# Patient Record
Sex: Male | Born: 1970 | Race: White | Hispanic: No | Marital: Married | Smoking: Current every day smoker
Health system: Southern US, Community
[De-identification: ages and names within clinical notes are randomized; demographics above are authoritative.]

## PROBLEM LIST (undated history)

## (undated) DIAGNOSIS — I10 Essential (primary) hypertension: Secondary | ICD-10-CM

---

## 2005-05-08 ENCOUNTER — Emergency Department: Payer: Self-pay | Admitting: Emergency Medicine

## 2006-05-29 ENCOUNTER — Emergency Department: Payer: Self-pay | Admitting: Emergency Medicine

## 2009-09-23 ENCOUNTER — Emergency Department: Payer: Self-pay | Admitting: Emergency Medicine

## 2009-09-30 ENCOUNTER — Emergency Department: Payer: Self-pay | Admitting: Emergency Medicine

## 2010-11-23 IMAGING — CT CT CHEST W/ CM
2 series · 15 of 32 positions shown, 19 images · non-contrast
Comparison: none

REASON FOR EXAM: left lower posterior and lateral severe back pain and
diaphoresis
COMMENTS:   LMP: (Male)

[Series 5: soft tissue · axial · 0.67mm/px · 1 of 106 slices shown]
[im 9/106  mediastinal]
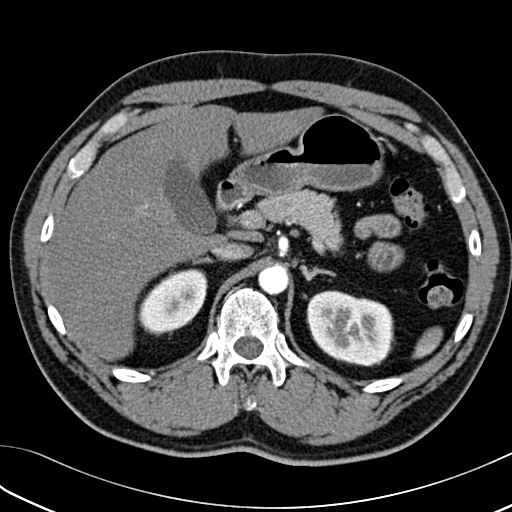

[Series 6: lung windows · axial · 0.67mm/px · z∈[-290,-26]mm · 14 of 104 slices shown, 18 images]
[im 8/104  mediastinal]
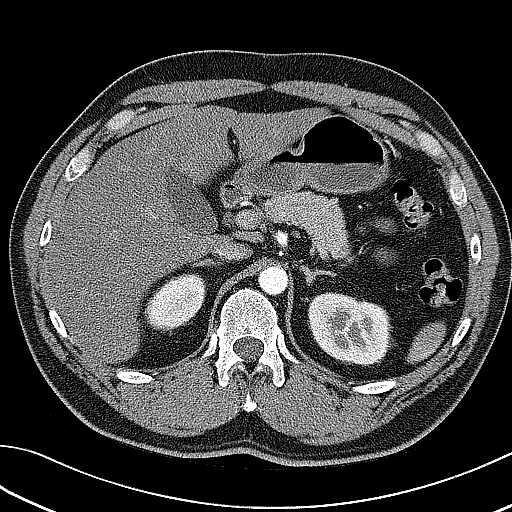
[im 8/104  lung]
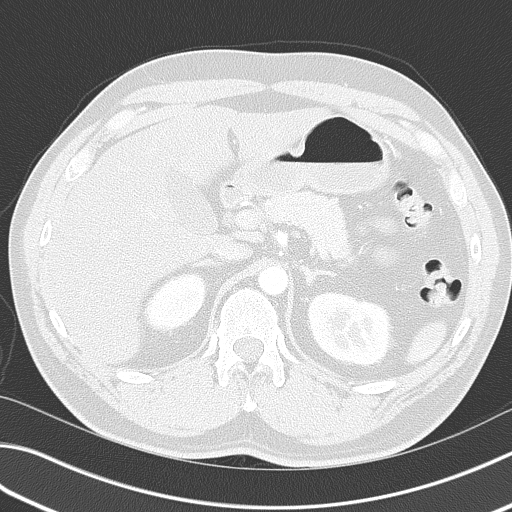
[im 16/104  lung]
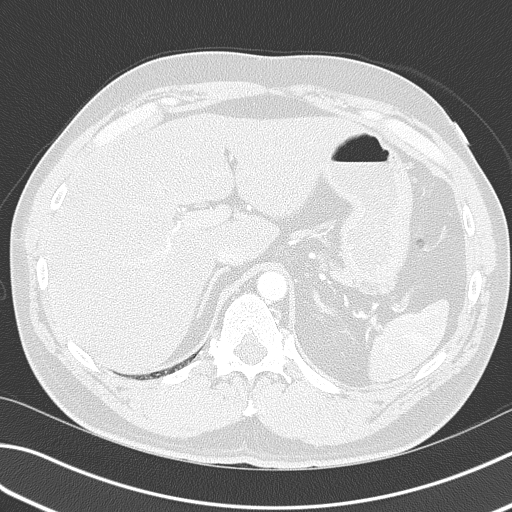
[im 24/104  lung]
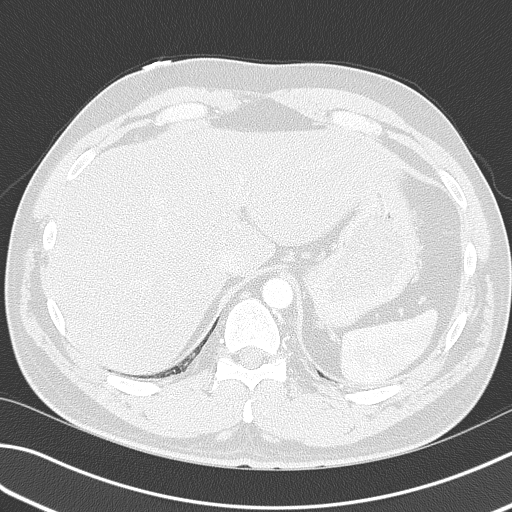
[im 32/104  lung]
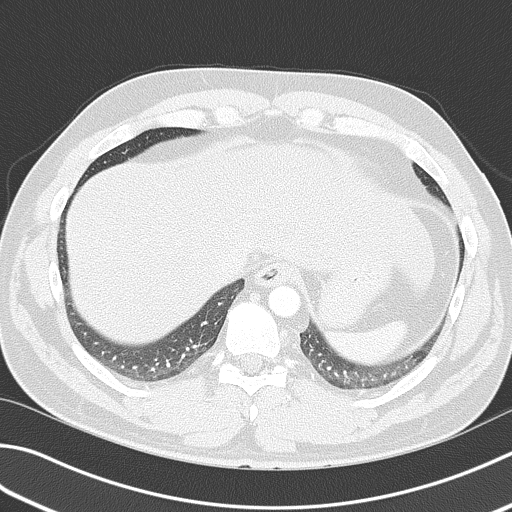
[im 40/104  mediastinal]
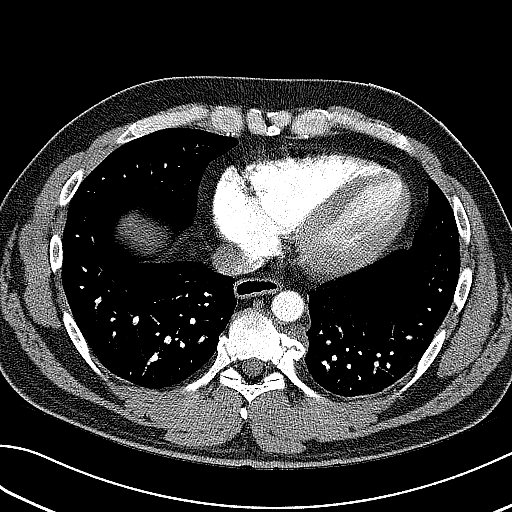
[im 40/104  lung]
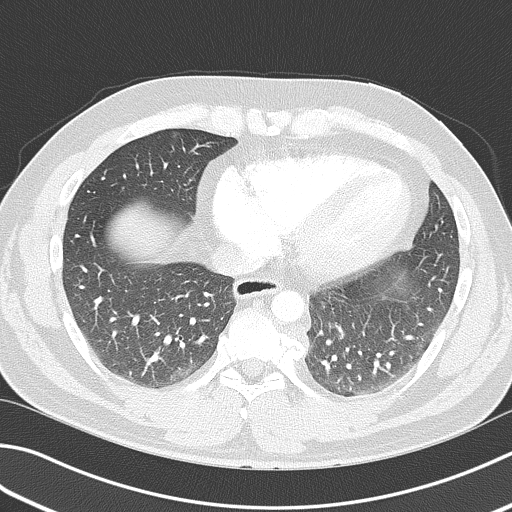
[im 48/104  lung]
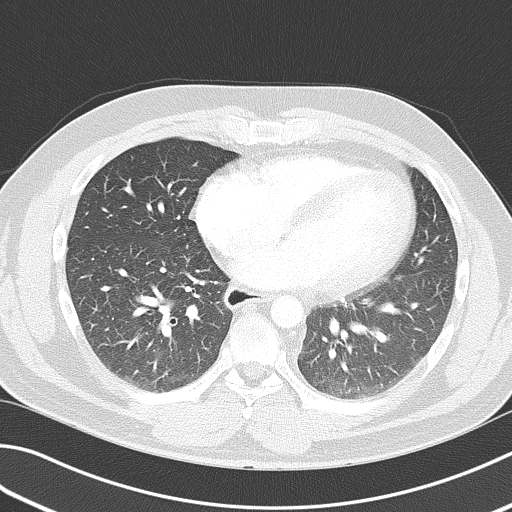
[im 49/104  lung]
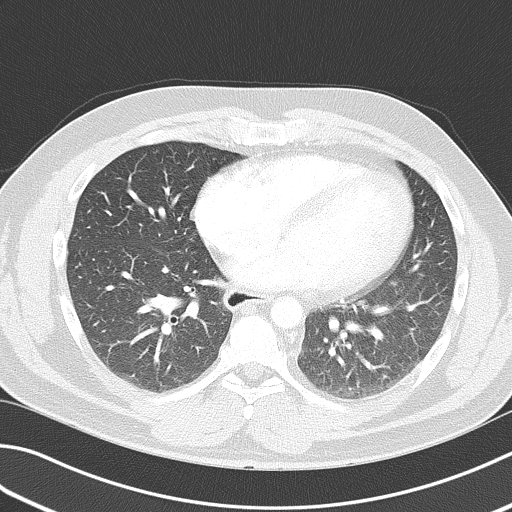
[im 52/104  lung]
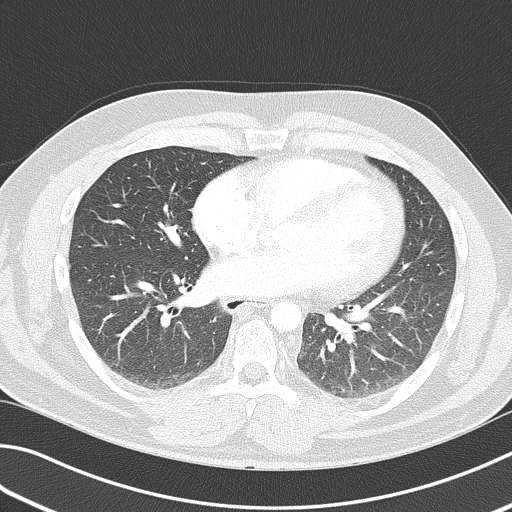
[im 56/104  mediastinal]
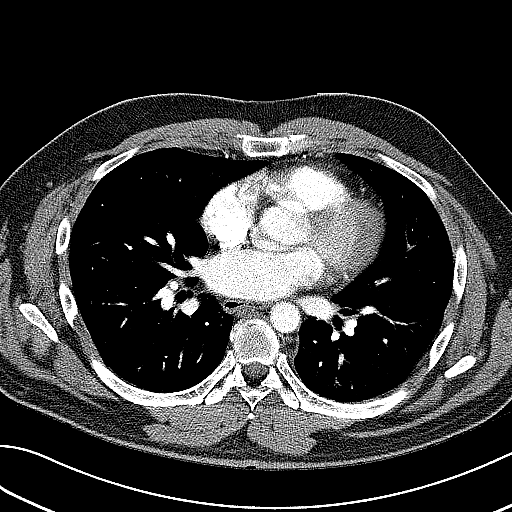
[im 56/104  lung]
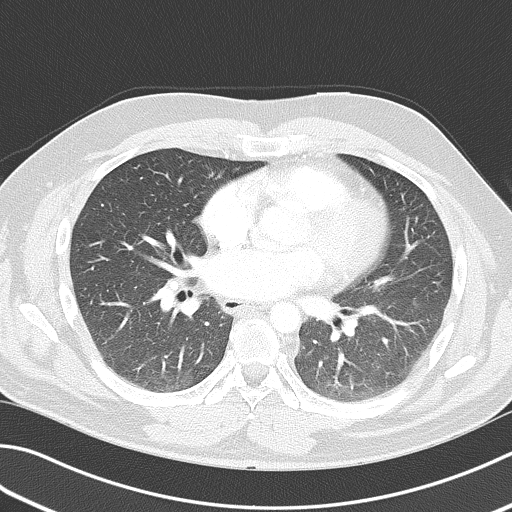
[im 64/104  lung]
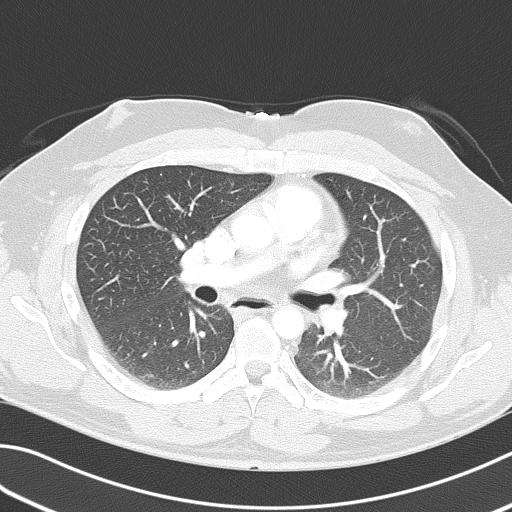
[im 72/104  lung]
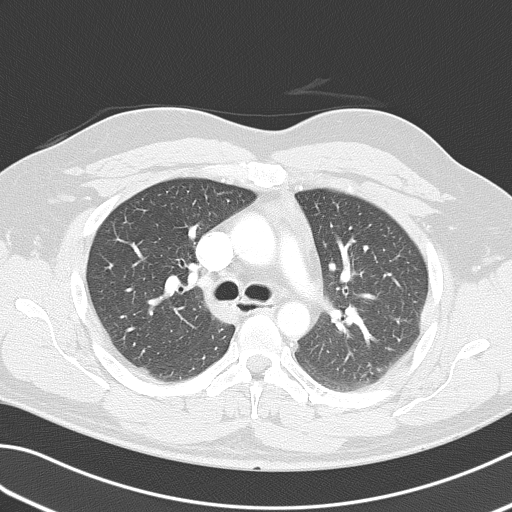
[im 80/104  lung]
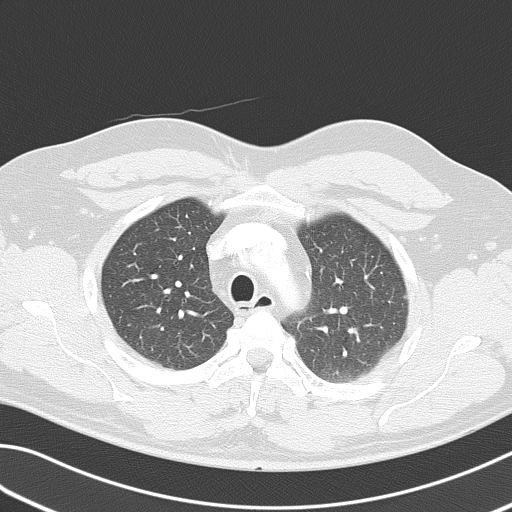
[im 88/104  mediastinal]
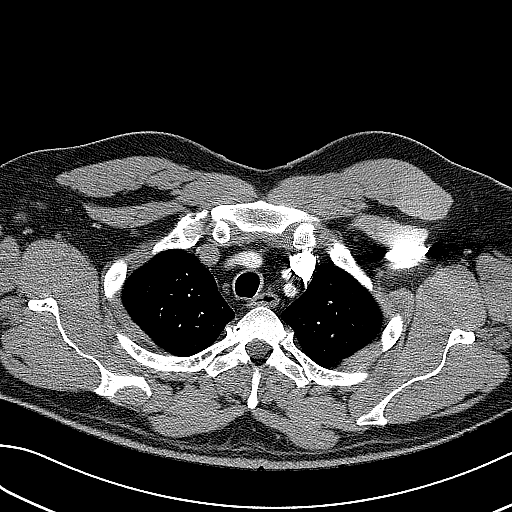
[im 88/104  lung]
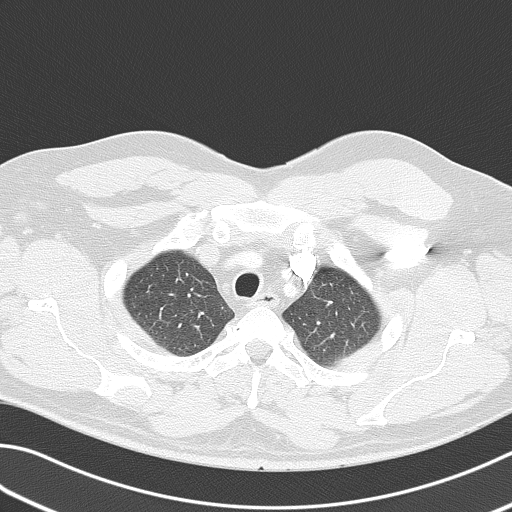
[im 96/104  lung]
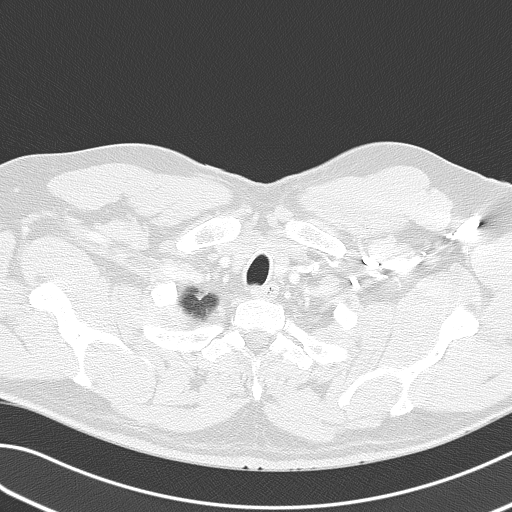

[15 of 32 positions shown; findings below may reference images not displayed]

PROCEDURE:     CT  - CT CHEST (FOR PE) W  - September 30, 2009  [DATE]

RESULT:     Emergent CT of the chest is performed with 100 mL of [id number]
iodinated intravenous contrast. Images are reconstructed at 3 mm slice
thickness in the axial plane. The patient has no previous CT for comparison.

The pulmonary arteries opacify without filling defects to suggest pulmonary
embolism. No thoracic aortic aneurysm or dissection is demonstrated. There
is no mediastinal or hilar mass or adenopathy. There is mild dependent
atelectasis bilaterally. No pulmonary mass is evident. There is shallow
inspiration. The included upper abdominal structures are within normal
limits.
IMPRESSION: Unremarkable CT of the chest. Minimal dependent atelectasis is demonstrated.
The thoracic aorta is unremarkable. There is no pulmonary embolism
demonstrated.

## 2010-11-23 IMAGING — CR DG CHEST 1V PORT
1 series · 1 of 1 positions shown · non-contrast
Comparison: none

REASON FOR EXAM: chest pain
COMMENTS:   LMP: (Male)

[view not recorded]
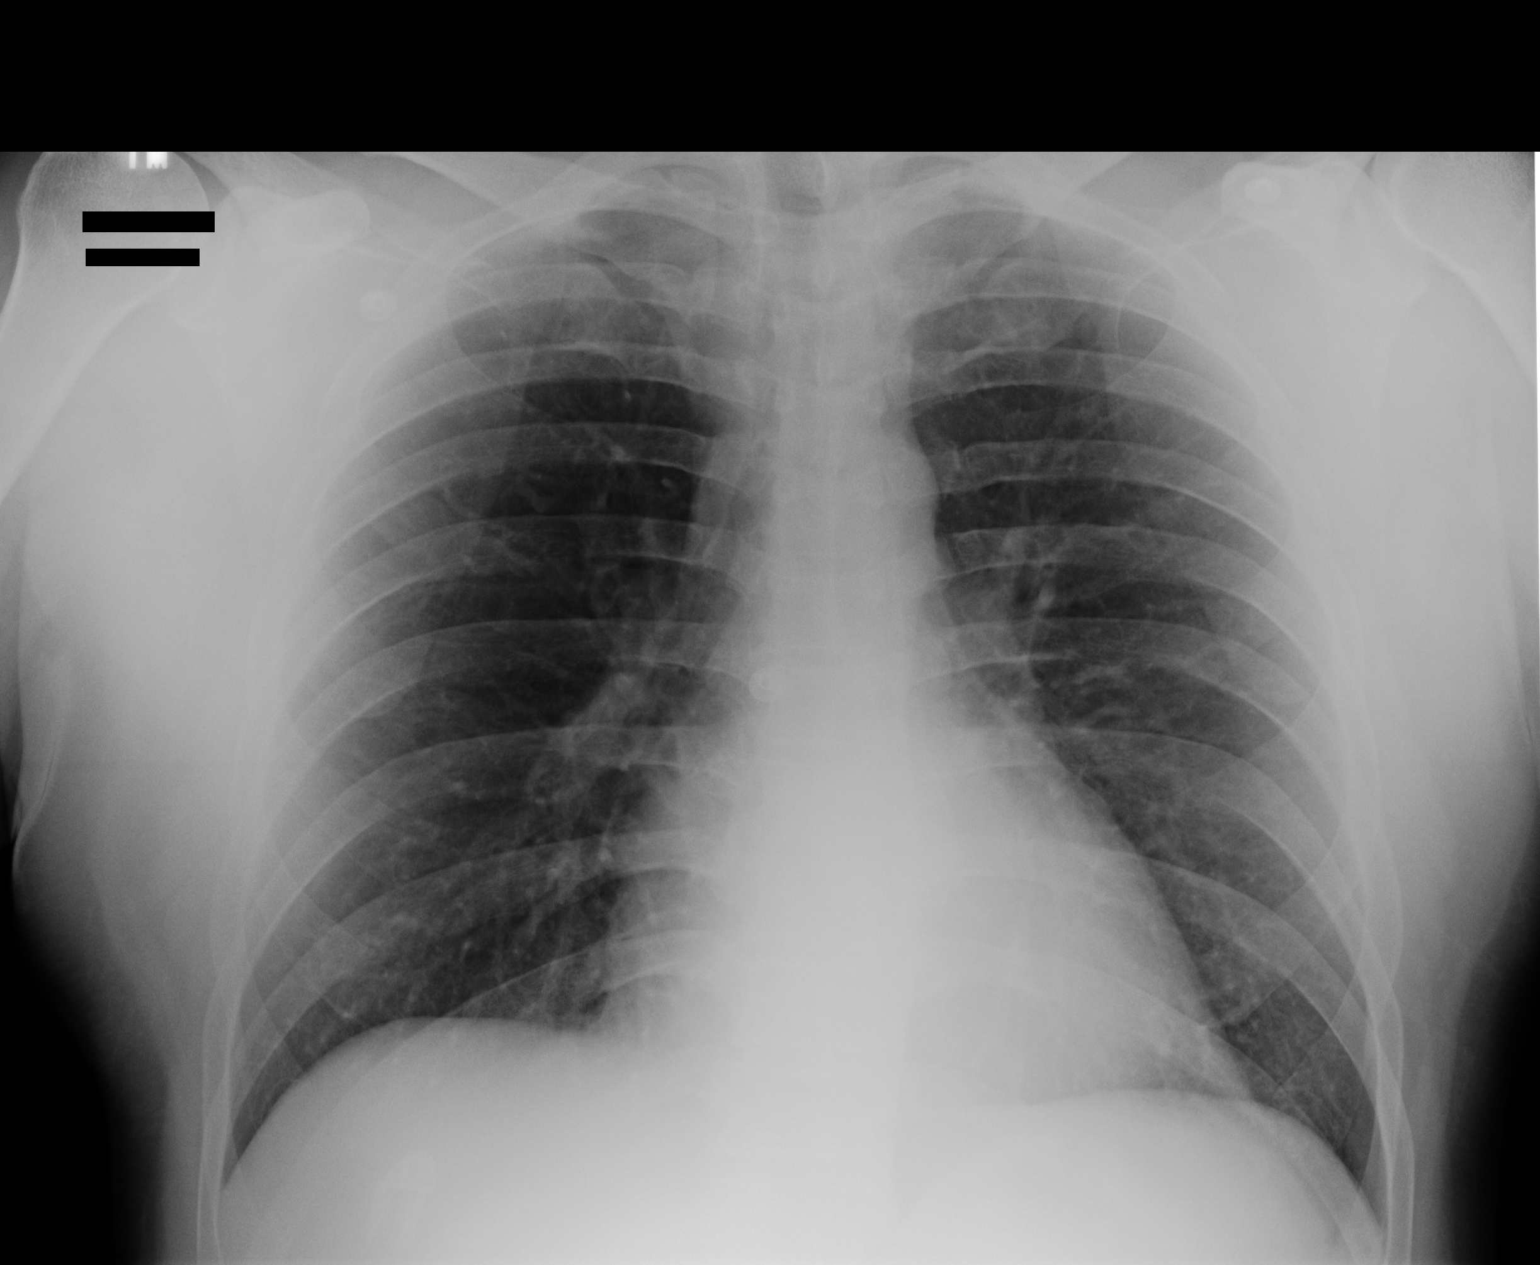

[1 of 1 positions shown; findings below may reference images not displayed]

PROCEDURE:     DXR - DXR PORTABLE CHEST SINGLE VIEW  - September 30, 2009  [DATE]

RESULT:     Comparison is made to the study 23 September, 2009.

The lungs are well-expanded and clear. The heart is normal in size. The
pulmonary vascularity is not engorged. There is no pleural effusion. There
is no pneumothorax. The bony thorax appears normal.
IMPRESSION: I do not see evidence of acute cardiopulmonary abnormality.
Given the patient's persistent symptoms, followup chest CT scanning may be
useful.

## 2019-06-20 ENCOUNTER — Emergency Department
Admission: EM | Admit: 2019-06-20 | Discharge: 2019-06-20 | Disposition: A | Discharge status: Home / Self Care | Payer: Self-pay | Attending: Emergency Medicine | Admitting: Emergency Medicine

## 2019-06-20 ENCOUNTER — Encounter: Payer: Self-pay | Admitting: Emergency Medicine

## 2019-06-20 ENCOUNTER — Other Ambulatory Visit: Payer: Self-pay

## 2019-06-20 DIAGNOSIS — W230XXA Caught, crushed, jammed, or pinched between moving objects, initial encounter: Secondary | ICD-10-CM | POA: Insufficient documentation

## 2019-06-20 DIAGNOSIS — S61210A Laceration without foreign body of right index finger without damage to nail, initial encounter: Secondary | ICD-10-CM | POA: Insufficient documentation

## 2019-06-20 DIAGNOSIS — Z79899 Other long term (current) drug therapy: Secondary | ICD-10-CM | POA: Insufficient documentation

## 2019-06-20 DIAGNOSIS — S60121A Contusion of right index finger with damage to nail, initial encounter: Secondary | ICD-10-CM | POA: Insufficient documentation

## 2019-06-20 DIAGNOSIS — Y92810 Car as the place of occurrence of the external cause: Secondary | ICD-10-CM | POA: Insufficient documentation

## 2019-06-20 DIAGNOSIS — F17210 Nicotine dependence, cigarettes, uncomplicated: Secondary | ICD-10-CM | POA: Insufficient documentation

## 2019-06-20 DIAGNOSIS — Y999 Unspecified external cause status: Secondary | ICD-10-CM | POA: Insufficient documentation

## 2019-06-20 DIAGNOSIS — Y9389 Activity, other specified: Secondary | ICD-10-CM | POA: Insufficient documentation

## 2019-06-20 MED ORDER — TRANEXAMIC ACID 1000 MG/10ML IV SOLN
500.0000 mg | Freq: Once | INTRAVENOUS | Status: DC
  Filled 2019-06-20: qty 10

## 2019-06-20 MED ORDER — ONDANSETRON 4 MG PO TBDP
4.0000 mg | ORAL_TABLET | Freq: Once | ORAL | Status: AC
  Administered 2019-06-20: 4 mg via ORAL
  Filled 2019-06-20: qty 1

## 2019-06-20 MED ORDER — MELOXICAM 15 MG PO TABS: 15.0000 mg | ORAL_TABLET | Freq: Every day | ORAL | 1 refills | Status: AC

## 2019-06-20 MED ORDER — OXYCODONE-ACETAMINOPHEN 5-325 MG PO TABS
1.0000 | ORAL_TABLET | Freq: Once | ORAL | Status: AC
  Administered 2019-06-20: 1 via ORAL
  Filled 2019-06-20: qty 1

## 2019-06-20 NOTE — ED Triage Notes (Signed)
Pt in via POV, reports having hand shut in a van door, laceration to right index finger noted, bleeding controlled at this time.

## 2019-06-20 NOTE — ED Provider Notes (Signed)
[facility] Emergency Department Provider Note  ____________________________________________  Time seen: Approximately 9:47 PM  I have reviewed the triage vital signs and the nursing notes.   HISTORY  Chief Complaint Laceration    HPI Ernest Harrington is a 48 y.o. male presents to the emergency department with a 2 cm laceration along the volar aspect of the right index finger.  Patient states that he shot a van door on his finger earlier today.  Patient has a small subungual hematoma that is associated with injury.  He denies numbness and tingling in the right hand.  He reports that his tetanus status is up-to-date.  No other alleviating measures have been attempted.        History reviewed. No pertinent past medical history.  There are no active problems to display for this patient.   History reviewed. No pertinent surgical history.  Prior to Admission medications   Medication Sig Start Date End Date Taking? Authorizing Provider  hydrochlorothiazide (HYDRODIURIL) 25 MG tablet Take 25 mg by mouth daily.   Yes [provider]  lisinopril (ZESTRIL) 40 MG tablet Take 40 mg by mouth daily.   Yes [provider]  meloxicam (MOBIC) 15 MG tablet Take 1 tablet (15 mg total) by mouth daily for 7 days. 06/20/19 06/27/19  Orvil Feil, PA-C    Allergies Patient has no known allergies.  No family history on file.  Social History Social History   Tobacco Use  . Smoking status: Current Every Day Smoker    Packs/day: 1.00    Types: Cigarettes  . Smokeless tobacco: Never Used  Substance Use Topics  . Alcohol use: Yes  . Drug use: Never     Review of Systems  Constitutional: No fever/chills Eyes: No visual changes. No discharge ENT: No upper respiratory complaints. Cardiovascular: no chest pain. Respiratory: no cough. No SOB. Gastrointestinal: No abdominal pain.  No nausea, no vomiting.  No diarrhea.  No  constipation. Genitourinary: Negative for dysuria. No hematuria Musculoskeletal: Patient has right index finger pain.  Skin: Patient has 2 cm laceration at right index finger.  Neurological: Negative for headaches, focal weakness or numbness.   ____________________________________________   PHYSICAL EXAM:  VITAL SIGNS: ED Triage Vitals  Enc Vitals Group     BP 06/20/19 1859 (!) 94/57     Pulse Rate 06/20/19 1859 65     Resp 06/20/19 1859 (!) 22     Temp 06/20/19 1859 98.7 F (37.1 C)     Temp Source 06/20/19 2134 Oral     SpO2 06/20/19 1859 97 %     Weight 06/20/19 1855 185 lb (83.9 kg)     Height 06/20/19 1855 5\' 7"  (1.702 m)     Head Circumference --      Peak Flow --      Pain Score 06/20/19 1855 10     Pain Loc --      Pain Edu? --      Excl. in [place]? --      Constitutional: Alert and oriented. Well appearing and in no acute distress. Eyes: Conjunctivae are normal. PERRL. EOMI. Head: Atraumatic.  Cardiovascular: Normal rate, regular rhythm. Normal S1 and S2.  Good peripheral circulation. Respiratory: Normal respiratory effort without tachypnea or retractions. Lungs CTAB. Good air entry to the bases with no decreased or absent breath sounds. Gastrointestinal: Bowel sounds 4 quadrants. Soft and nontender to palpation. No guarding or rigidity. No palpable masses. No distention. No CVA tenderness. Musculoskeletal: Full  range of motion to all extremities. No gross deformities appreciated.  No extensor or flexor tendon deficits appreciated with testing of right index finger.  Palpable radial pulse bilaterally and symmetrically. Neurologic:  Normal speech and language. No gross focal neurologic deficits are appreciated.  Skin: Patient has 2 cm laceration along the volar aspect of the right index finger.  Laceration is deep to underlying dermis.  Patient has small subungual hematoma of right index finger. Psychiatric: Mood and affect are normal. Speech and behavior are normal.  Patient exhibits appropriate insight and judgement.   ____________________________________________   LABS (all labs ordered are listed, but only abnormal results are displayed)  Labs Reviewed - No data to display ____________________________________________  EKG   ____________________________________________  RADIOLOGY   No results found.  ____________________________________________    PROCEDURES  Procedure(s) performed:    Procedures  LACERATION REPAIR Performed by: Lannie Fields Authorized by: Lannie Fields Consent: Verbal consent obtained. Risks and benefits: risks, benefits and alternatives were discussed Consent given by: patient Patient identity confirmed: provided demographic data Prepped and Draped in normal sterile fashion Wound explored  Laceration Location: Right index finger   Laceration Length: 1.5 cm  No Foreign Bodies seen or palpated  Skin closure: Dermabond   Patient tolerance: Patient tolerated the procedure well with no immediate complications.   Medications  tranexamic acid (CYKLOKAPRON) injection 500 mg (0 mg Topical Hold 06/20/19 2042)  oxyCODONE-acetaminophen (PERCOCET/ROXICET) 5-325 MG per tablet 1 tablet (1 tablet Oral Given 06/20/19 2046)  ondansetron (ZOFRAN-ODT) disintegrating tablet 4 mg (4 mg Oral Given 06/20/19 2046)     ____________________________________________   INITIAL IMPRESSION / ASSESSMENT AND PLAN / ED COURSE  Pertinent labs & imaging results that were available during my care of the patient were reviewed by me and considered in my medical decision making (see chart for details).  Review of the [place] CSRS was performed in accordance of the [place] prior to dispensing any controlled drugs.         Assessment and plan Right index finger laceration 48 year old male presents to the emergency department with a right index finger laceration sustained accidentally after he shut his finger in a car door.  Patient  declined x-ray examination of the right hand stating that he did not think anything was broken.  Laceration was repaired in the emergency department using Dermabond. Patient education regarding wound care was given.   Patient was discharged with Meloxicam. Return precautions were given. All patient questions were answered.     ____________________________________________  FINAL CLINICAL IMPRESSION(S) / ED DIAGNOSES  Final diagnoses:  Laceration of right index finger without foreign body without damage to nail, initial encounter      NEW MEDICATIONS STARTED DURING THIS VISIT:  ED Discharge Orders         Ordered    meloxicam (MOBIC) 15 MG tablet  Daily     06/20/19 2126              This chart was dictated using voice recognition software/Dragon. Despite best efforts to proofread, errors can occur which can change the meaning. Any change was purely unintentional.    Lannie Fields, PA-C 06/20/19 2226    Nena Polio, MD 06/21/19 2136

## 2019-11-02 ENCOUNTER — Encounter: Payer: Self-pay | Admitting: Intensive Care

## 2019-11-02 ENCOUNTER — Emergency Department
Admission: EM | Admit: 2019-11-02 | Discharge: 2019-11-02 | Disposition: A | Discharge status: Home / Self Care | Payer: Self-pay | Attending: Emergency Medicine | Admitting: Emergency Medicine

## 2019-11-02 ENCOUNTER — Other Ambulatory Visit: Payer: Self-pay

## 2019-11-02 ENCOUNTER — Emergency Department: Payer: Self-pay

## 2019-11-02 DIAGNOSIS — M545 Low back pain, unspecified: Secondary | ICD-10-CM

## 2019-11-02 DIAGNOSIS — Z79899 Other long term (current) drug therapy: Secondary | ICD-10-CM | POA: Insufficient documentation

## 2019-11-02 DIAGNOSIS — R319 Hematuria, unspecified: Secondary | ICD-10-CM | POA: Insufficient documentation

## 2019-11-02 DIAGNOSIS — F17210 Nicotine dependence, cigarettes, uncomplicated: Secondary | ICD-10-CM | POA: Insufficient documentation

## 2019-11-02 DIAGNOSIS — I10 Essential (primary) hypertension: Secondary | ICD-10-CM | POA: Insufficient documentation

## 2019-11-02 HISTORY — DX: Essential (primary) hypertension: I10

## 2019-11-02 LAB — URINALYSIS, COMPLETE (UACMP) WITH MICROSCOPIC
Bilirubin Urine: NEGATIVE
Glucose, UA: NEGATIVE mg/dL
Hgb urine dipstick: NEGATIVE
Ketones, ur: NEGATIVE mg/dL
Leukocytes,Ua: NEGATIVE
Nitrite: NEGATIVE
Protein, ur: NEGATIVE mg/dL
Specific Gravity, Urine: 1.017 (ref 1.005–1.030)
pH: 7 (ref 5.0–8.0)

## 2019-11-02 LAB — COMPREHENSIVE METABOLIC PANEL
ALT: 83 U/L — ABNORMAL HIGH (ref 0–44)
AST: 46 U/L — ABNORMAL HIGH (ref 15–41)
Albumin: 4.4 g/dL (ref 3.5–5.0)
Alkaline Phosphatase: 64 U/L (ref 38–126)
Anion gap: 10 (ref 5–15)
BUN: 21 mg/dL — ABNORMAL HIGH (ref 6–20)
CO2: 28 mmol/L (ref 22–32)
Calcium: 9.4 mg/dL (ref 8.9–10.3)
Chloride: 100 mmol/L (ref 98–111)
Creatinine, Ser: 1.1 mg/dL (ref 0.61–1.24)
GFR calc Af Amer: >60 mL/min (ref >60)
GFR calc non Af Amer: >60 mL/min (ref >60)
Glucose, Bld: 134 mg/dL — ABNORMAL HIGH (ref 70–99)
Potassium: 4.1 mmol/L (ref 3.5–5.1)
Sodium: 138 mmol/L (ref 135–145)
Total Bilirubin: 0.7 mg/dL (ref 0.3–1.2)
Total Protein: 8.1 g/dL (ref 6.5–8.1)

## 2019-11-02 LAB — CBC WITH DIFFERENTIAL/PLATELET
Abs Immature Granulocytes: 0.07 10*3/uL (ref 0.00–0.07)
Basophils Absolute: 0.1 10*3/uL (ref 0.0–0.1)
Basophils Relative: 1 %
Eosinophils Absolute: 0.4 10*3/uL (ref 0.0–0.5)
Eosinophils Relative: 4 %
HCT: 43.3 % (ref 39.0–52.0)
Hemoglobin: 15.1 g/dL (ref 13.0–17.0)
Immature Granulocytes: 1 %
Lymphocytes Relative: 29 %
Lymphs Abs: 3 10*3/uL (ref 0.7–4.0)
MCH: 33.2 pg (ref 26.0–34.0)
MCHC: 34.9 g/dL (ref 30.0–36.0)
MCV: 95.2 fL (ref 80.0–100.0)
Monocytes Absolute: 1.2 10*3/uL — ABNORMAL HIGH (ref 0.1–1.0)
Monocytes Relative: 12 %
Neutro Abs: 5.5 10*3/uL (ref 1.7–7.7)
Neutrophils Relative %: 53 %
Platelets: 295 10*3/uL (ref 150–400)
RBC: 4.55 MIL/uL (ref 4.22–5.81)
RDW: 12.6 % (ref 11.5–15.5)
WBC: 10.3 10*3/uL (ref 4.0–10.5)
nRBC: 0 % (ref 0.0–0.2)

## 2019-11-02 NOTE — ED Triage Notes (Signed)
Patient c/o back pain and groin pain since August 2020. This past Tuesday patient noticed blood in his urine with frequency at night. Denies pain urinating or trouble urinating.

## 2019-11-02 NOTE — Discharge Instructions (Signed)
Your lab tests and ultrasound today were all okay. Please follow up with your doctor for continued monitoring of your symptoms.  Results for orders placed or performed during the hospital encounter of 11/02/19  Urinalysis, Complete w Microscopic  Result Value Ref Range   Color, Urine YELLOW (A) YELLOW   APPearance CLEAR (A) CLEAR   Specific Gravity, Urine 1.017 1.005 - 1.030   pH 7.0 5.0 - 8.0   Glucose, UA NEGATIVE NEGATIVE mg/dL   Hgb urine dipstick NEGATIVE NEGATIVE   Bilirubin Urine NEGATIVE NEGATIVE   Ketones, ur NEGATIVE NEGATIVE mg/dL   Protein, ur NEGATIVE NEGATIVE mg/dL   Nitrite NEGATIVE NEGATIVE   Leukocytes,Ua NEGATIVE NEGATIVE   RBC / HPF 0-5 0 - 5 RBC/hpf   WBC, UA 0-5 0 - 5 WBC/hpf   Bacteria, UA RARE (A) NONE SEEN   Squamous Epithelial / LPF 0-5 0 - 5   Mucus PRESENT   CBC with Differential  Result Value Ref Range   WBC 10.3 4.0 - 10.5 K/uL   RBC 4.55 4.22 - 5.81 MIL/uL   Hemoglobin 15.1 13.0 - 17.0 g/dL   HCT 08.6 57.8 - 46.9 %   MCV 95.2 80.0 - 100.0 fL   MCH 33.2 26.0 - 34.0 pg   MCHC 34.9 30.0 - 36.0 g/dL   RDW 62.9 52.8 - 41.3 %   Platelets 295 150 - 400 K/uL   nRBC 0.0 0.0 - 0.2 %   Neutrophils Relative % 53 %   Neutro Abs 5.5 1.7 - 7.7 K/uL   Lymphocytes Relative 29 %   Lymphs Abs 3.0 0.7 - 4.0 K/uL   Monocytes Relative 12 %   Monocytes Absolute 1.2 (H) 0.1 - 1.0 K/uL   Eosinophils Relative 4 %   Eosinophils Absolute 0.4 0.0 - 0.5 K/uL   Basophils Relative 1 %   Basophils Absolute 0.1 0.0 - 0.1 K/uL   Immature Granulocytes 1 %   Abs Immature Granulocytes 0.07 0.00 - 0.07 K/uL  Comprehensive metabolic panel  Result Value Ref Range   Sodium 138 135 - 145 mmol/L   Potassium 4.1 3.5 - 5.1 mmol/L   Chloride 100 98 - 111 mmol/L   CO2 28 22 - 32 mmol/L   Glucose, Bld 134 (H) 70 - 99 mg/dL   BUN 21 (H) 6 - 20 mg/dL   Creatinine, Ser 2.44 0.61 - 1.24 mg/dL   Calcium 9.4 8.9 - 01.0 mg/dL   Total Protein 8.1 6.5 - 8.1 g/dL   Albumin 4.4 3.5 - 5.0  g/dL   AST 46 (H) 15 - 41 U/L   ALT 83 (H) 0 - 44 U/L   Alkaline Phosphatase 64 38 - 126 U/L   Total Bilirubin 0.7 0.3 - 1.2 mg/dL   GFR calc non Af Amer >60 >60 mL/min   GFR calc Af Amer >60 >60 mL/min   Anion gap 10 5 - 15   US Abdomen Complete  Result Date: 11/02/2019 CLINICAL DATA:  Low back pain radiating to the groin for several months with hematuria. EXAM: ABDOMEN ULTRASOUND COMPLETE COMPARISON:  None. FINDINGS: Gallbladder: No gallstones or wall thickening visualized. No sonographic Murphy sign noted by sonographer. Common bile duct: Diameter: 4 mm Liver: Liver parenchyma is diffusely echogenic with posterior acoustic attenuation, compatible with diffuse hepatic steatosis. No definite liver surface irregularity. No liver masses, noting decreased sensitivity in the setting of an echogenic liver. Portal vein is patent on color Doppler imaging with normal direction of blood flow towards the  liver. IVC: No abnormality visualized. Pancreas: Visualized portion unremarkable. Spleen: Size and appearance within normal limits. Right Kidney: Length: 12.4 cm. Echogenicity within normal limits. No mass or hydronephrosis visualized. Left Kidney: Length: 12.3 cm. Echogenicity within normal limits. No mass or hydronephrosis visualized. Abdominal aorta: No aneurysm visualized. Other findings: None. IMPRESSION: Diffuse hepatic steatosis.  Otherwise normal abdominal sonogram. Electronically Signed   By: Ilona Sorrel M.D.   On: 11/02/2019 11:34

## 2019-11-02 NOTE — ED Notes (Signed)
Pt ambulatory to the restroom without assistance.  

## 2019-11-02 NOTE — ED Notes (Signed)
Jon Gills, Student drew blood from patient and sent to lab

## 2019-11-02 NOTE — ED Provider Notes (Signed)
[facility] Emergency Department Provider Note  ____________________________________________  Time seen: Approximately 12:29 PM  I have reviewed the triage vital signs and the nursing notes.   HISTORY  Chief Complaint Hematuria and Back Pain    HPI Ernest Harrington is a 49 y.o. male with a history of hypertension who comes the ED complaining of hematuria and his blood about 4 days ago.  No continued urinary issues.  Also has chronic low back pain radiating to left testicle.  No fevers chills vomiting diarrhea constipation.  Pain is mild to moderate, no aggravating or alleviating factors.  Saw his doctor 2 weeks ago who noted no abnormalities on exam, and patient reports blood work was unremarkable..   Patient reports back pain has been there for many months, he works as a Curator.  No acute changes, worse with twisting and turning motions.   Past Medical History:  Diagnosis Date  . Hypertension      There are no problems to display for this patient.    History reviewed. No pertinent surgical history.   Prior to Admission medications   Medication Sig Start Date End Date Taking? Authorizing Provider  hydrochlorothiazide (HYDRODIURIL) 25 MG tablet Take 25 mg by mouth daily.   Yes [provider]  lisinopril (ZESTRIL) 40 MG tablet Take 40 mg by mouth daily.   Yes [provider]  Multiple Vitamin (MULTIVITAMIN WITH MINERALS) TABS tablet Take 1 tablet by mouth daily.   Yes [provider]     Allergies Patient has no known allergies.   History reviewed. No pertinent family history.  Social History Social History   Tobacco Use  . Smoking status: Current Every Day Smoker    Packs/day: 1.00    Types: Cigarettes  . Smokeless tobacco: Never Used  Substance Use Topics  . Alcohol use: Yes    Alcohol/week: 24.0 standard drinks    Types: 24 Cans of beer per week  . Drug use: Never    Review of  Systems  Constitutional:   No fever or chills.  ENT:   No sore throat. No rhinorrhea. Cardiovascular:   No chest pain or syncope. Respiratory:   No dyspnea or cough. Gastrointestinal:   Negative for abdominal pain, vomiting and diarrhea.  Musculoskeletal:   Positive low back pain as above All other systems reviewed and are negative except as documented above in ROS and HPI.  ____________________________________________   PHYSICAL EXAM:  VITAL SIGNS: ED Triage Vitals  Enc Vitals Group     BP 11/02/19 1024 (!) 190/80     Pulse Rate 11/02/19 1024 96     Resp 11/02/19 1024 16     Temp 11/02/19 1020 98.3 F (36.8 C)     Temp Source 11/02/19 1020 Oral     SpO2 11/02/19 1024 97 %     Weight 11/02/19 1021 197 lb (89.4 kg)     Height 11/02/19 1021 5\' 8"  (1.727 m)     Head Circumference --      Peak Flow --      Pain Score 11/02/19 1020 5     Pain Loc --      Pain Edu? --      Excl. in [place]? --     Vital signs reviewed, nursing assessments reviewed.   Constitutional:   Alert and oriented. Non-toxic appearance. Eyes:   Conjunctivae are normal. EOMI. PERRL. ENT      Head:   Normocephalic and atraumatic.  Nose:   Wearing a mask.      Mouth/Throat:   Wearing a mask.      Neck:   No meningismus. Full ROM. Hematological/Lymphatic/Immunilogical:   No cervical or inguinal lymphadenopathy. Cardiovascular:   RRR. Symmetric bilateral radial and DP pulses.  No murmurs. Cap refill less than 2 seconds. Respiratory:   Normal respiratory effort without tachypnea/retractions. Breath sounds are clear and equal bilaterally. No wheezes/rales/rhonchi. Gastrointestinal:   Soft and nontender. Non distended. There is no CVA tenderness.  No rebound, rigidity, or guarding. Genitourinary: Normal.  No testicular swelling or tenderness.  No hernia, examined standing.  No inguinal lymphadenopathy Musculoskeletal:   Normal range of motion in all extremities. No joint effusions.  No lower extremity  tenderness.  No edema.  Left low back tenderness reproducing musculoskeletal pain. Neurologic:   Normal speech and language.  Motor grossly intact. No acute focal neurologic deficits are appreciated.  Skin:    Skin is warm, dry and intact. No rash noted.  No petechiae, purpura, or bullae.  ____________________________________________    LABS (pertinent positives/negatives) (all labs ordered are listed, but only abnormal results are displayed) Labs Reviewed  URINALYSIS, COMPLETE (UACMP) WITH MICROSCOPIC - Abnormal; Notable for the following components:      Result Value   Color, Urine YELLOW (*)    APPearance CLEAR (*)    Bacteria, UA RARE (*)    All other components within normal limits  CBC WITH DIFFERENTIAL/PLATELET - Abnormal; Notable for the following components:   Monocytes Absolute 1.2 (*)    All other components within normal limits  COMPREHENSIVE METABOLIC PANEL - Abnormal; Notable for the following components:   Glucose, Bld 134 (*)    BUN 21 (*)    AST 46 (*)    ALT 83 (*)    All other components within normal limits   ____________________________________________   EKG    ____________________________________________    RADIOLOGY  US Abdomen Complete  Result Date: 11/02/2019 CLINICAL DATA:  Low back pain radiating to the groin for several months with hematuria. EXAM: ABDOMEN ULTRASOUND COMPLETE COMPARISON:  None. FINDINGS: Gallbladder: No gallstones or wall thickening visualized. No sonographic Murphy sign noted by sonographer. Common bile duct: Diameter: 4 mm Liver: Liver parenchyma is diffusely echogenic with posterior acoustic attenuation, compatible with diffuse hepatic steatosis. No definite liver surface irregularity. No liver masses, noting decreased sensitivity in the setting of an echogenic liver. Portal vein is patent on color Doppler imaging with normal direction of blood flow towards the liver. IVC: No abnormality visualized. Pancreas: Visualized  portion unremarkable. Spleen: Size and appearance within normal limits. Right Kidney: Length: 12.4 cm. Echogenicity within normal limits. No mass or hydronephrosis visualized. Left Kidney: Length: 12.3 cm. Echogenicity within normal limits. No mass or hydronephrosis visualized. Abdominal aorta: No aneurysm visualized. Other findings: None. IMPRESSION: Diffuse hepatic steatosis.  Otherwise normal abdominal sonogram. Electronically Signed   By: Delbert Phenix M.D.   On: 11/02/2019 11:34    ____________________________________________   PROCEDURES Procedures  ____________________________________________  DIFFERENTIAL DIAGNOSIS   Kidney stone, urinary tract infection, aortic aneurysm, renal mass, musculoskeletal pain  CLINICAL IMPRESSION / ASSESSMENT AND PLAN / ED COURSE  Medications ordered in the ED: Medications - No data to display  Pertinent labs & imaging results that were available during my care of the patient were reviewed by me and considered in my medical decision making (see chart for details).  Ernest Harrington was evaluated in Emergency Department on 11/02/2019 for the symptoms described in  the history of present illness. He was evaluated in the context of the global COVID-19 pandemic, which necessitated consideration that the patient might be at risk for infection with the SARS-CoV-2 virus that causes COVID-19. Institutional protocols and algorithms that pertain to the evaluation of patients at risk for COVID-19 are in a state of rapid change based on information released by regulatory bodies including the CDC and federal and state organizations. These policies and algorithms were followed during the patient's care in the ED.   Patient presents with an episode of hematuria as well as some chronic low back pain.  Exam is unremarkable.  Labs including creatinine and urinalysis are normal.  Ultrasound of the abdomen is unremarkable and negative for renal abnormality, urinary retention,  AAA, or other acute issues.  Stable for discharge and follow-up with primary care, referral to urology as needed.      ____________________________________________   FINAL CLINICAL IMPRESSION(S) / ED DIAGNOSES    Final diagnoses:  Hematuria, unspecified type  Left-sided low back pain without sciatica, unspecified chronicity     ED Discharge Orders    None      Portions of this note were generated with dragon dictation software. Dictation errors may occur despite best attempts at proofreading.   Sharman Cheek, MD 11/02/19 [phone]
